# Patient Record
Sex: Female | Born: 1970 | Race: White | Hispanic: No | Marital: Married | Smoking: Never smoker
Health system: Southern US, Community
[De-identification: ages and names within clinical notes are randomized; demographics above are authoritative.]

---

## 2006-01-30 ENCOUNTER — Other Ambulatory Visit: Admission: RE | Admit: 2006-01-30 | Discharge: 2006-01-30 | Payer: Self-pay | Admitting: Internal Medicine

## 2006-10-15 ENCOUNTER — Encounter: Admission: RE | Admit: 2006-10-15 | Discharge: 2006-10-15 | Payer: Self-pay | Admitting: Family Medicine

## 2007-03-18 ENCOUNTER — Ambulatory Visit (HOSPITAL_COMMUNITY): Admission: RE | Admit: 2007-03-18 | Discharge: 2007-03-18 | Payer: Self-pay | Admitting: Obstetrics and Gynecology

## 2007-03-18 ENCOUNTER — Other Ambulatory Visit: Admission: RE | Admit: 2007-03-18 | Discharge: 2007-03-18 | Payer: Self-pay | Admitting: Obstetrics and Gynecology

## 2007-03-31 ENCOUNTER — Ambulatory Visit (HOSPITAL_COMMUNITY): Admission: RE | Admit: 2007-03-31 | Discharge: 2007-03-31 | Payer: Self-pay | Admitting: Obstetrics and Gynecology

## 2007-04-21 ENCOUNTER — Ambulatory Visit (HOSPITAL_COMMUNITY): Admission: RE | Admit: 2007-04-21 | Discharge: 2007-04-21 | Payer: Self-pay | Admitting: Obstetrics and Gynecology

## 2007-05-05 ENCOUNTER — Ambulatory Visit (HOSPITAL_COMMUNITY): Admission: RE | Admit: 2007-05-05 | Discharge: 2007-05-05 | Payer: Self-pay | Admitting: Obstetrics and Gynecology

## 2007-05-29 ENCOUNTER — Ambulatory Visit (HOSPITAL_COMMUNITY): Admission: RE | Admit: 2007-05-29 | Discharge: 2007-05-29 | Payer: Self-pay | Admitting: Obstetrics and Gynecology

## 2007-06-26 ENCOUNTER — Ambulatory Visit (HOSPITAL_COMMUNITY): Admission: RE | Admit: 2007-06-26 | Discharge: 2007-06-26 | Payer: Self-pay | Admitting: Obstetrics and Gynecology

## 2007-09-30 ENCOUNTER — Encounter (INDEPENDENT_AMBULATORY_CARE_PROVIDER_SITE_OTHER): Payer: Self-pay | Admitting: Obstetrics and Gynecology

## 2007-09-30 ENCOUNTER — Inpatient Hospital Stay (HOSPITAL_COMMUNITY): Admission: RE | Admit: 2007-09-30 | Discharge: 2007-10-02 | Payer: Self-pay | Admitting: Obstetrics and Gynecology

## 2008-03-21 ENCOUNTER — Other Ambulatory Visit: Admission: RE | Admit: 2008-03-21 | Discharge: 2008-03-21 | Payer: Self-pay | Admitting: Obstetrics and Gynecology

## 2009-06-28 ENCOUNTER — Other Ambulatory Visit: Admission: RE | Admit: 2009-06-28 | Discharge: 2009-06-28 | Payer: Self-pay | Admitting: Obstetrics and Gynecology

## 2009-07-27 ENCOUNTER — Ambulatory Visit (HOSPITAL_COMMUNITY): Admission: RE | Admit: 2009-07-27 | Discharge: 2009-07-27 | Payer: Self-pay | Admitting: Obstetrics and Gynecology

## 2009-08-16 ENCOUNTER — Encounter: Admission: RE | Admit: 2009-08-16 | Discharge: 2009-08-16 | Payer: Self-pay | Admitting: Obstetrics and Gynecology

## 2010-06-17 ENCOUNTER — Encounter: Payer: Self-pay | Admitting: Obstetrics and Gynecology

## 2010-07-19 ENCOUNTER — Other Ambulatory Visit: Payer: Self-pay | Admitting: Obstetrics and Gynecology

## 2010-07-19 ENCOUNTER — Other Ambulatory Visit (HOSPITAL_COMMUNITY)
Admission: RE | Admit: 2010-07-19 | Discharge: 2010-07-19 | Disposition: A | Discharge status: Home / Self Care | Payer: BC Managed Care – PPO | Source: Ambulatory Visit | Attending: Obstetrics and Gynecology | Admitting: Obstetrics and Gynecology

## 2010-07-19 DIAGNOSIS — Z01419 Encounter for gynecological examination (general) (routine) without abnormal findings: Secondary | ICD-10-CM | POA: Insufficient documentation

## 2010-10-09 NOTE — H&P (Signed)
Courtney Vasquez, Courtney Vasquez NO.:  [id number]   MEDICAL RECORD NO.:  [id number]          PATIENT TYPE:  OUT   LOCATION:                                [facility]:  [facility]   PHYSICIAN:  Gerald Leitz, MD          DATE OF BIRTH:  09/22/2009   DATE OF ADMISSION:  DATE OF DISCHARGE:                              HISTORY & PHYSICAL   HISTORY OF PRESENT ILLNESS:  The patient is scheduled for surgery on Sep 30, 2007.   This is a 39 year old G3, P1-1-0-1 at 35 weeks estimated gestational  age, based on 8 weeks ultrasound with estimated date of delivery, Oct 07, 2007.  Pregnancy complicated by history of __________  infection,  marginal cord insertion, advanced maternal age, chronic back pain with a  bulged disc of L5 and S1.  The patient also desires permanent  sterilization.  She denies any regular contractions, positive fetal  movement.  No vaginal bleeding.  No regular contractions.   PAST MEDICAL HISTORY:  Negative.   PAST SURGICAL HISTORY:  Cesarean section x1 and BNA x1.   MEDICATIONS:  Prenatal vitamins.   ALLERGIES:  No known drug allergies.   PAST OB HISTORY:  Cesarean section x1 with breech presentation, missed  AB at 18 weeks, ___D&E_______  performed.   GYN HISTORY:  No STDs.  No history of abnormal Pap smears.   SOCIAL HISTORY:  The patient is single.  She works for [employer].  She denies tobacco, alcohol, or illicit drug use.   FAMILY HISTORY:  Negative for diabetes.   REVIEW OF SYSTEMS:  Negative, except as stated in history of current  illness.   PHYSICAL EXAMINATION:  VITAL SIGNS:  Blood pressure is 110/60 and weight  is 204 pounds.  GENERAL:  Alert, oriented, and no acute distress.  CARDIOVASCULAR:  Regular rate and rhythm.  LUNGS:  Clear to auscultation bilaterally.  ABDOMEN:  Gravid, nontender.  EXTREMITIES:  Trace edema.  Reflexes +2.  CERVIX:  At last check was closed, thick, and high.   ASSESSMENT AND PLAN:  History of 39 weeks'  pregnancy with a history of  cesarean section, desires repeat permanent sterilization.  Risks,  benefits, and alternatives of surgery were discussed with the patient  including but not limited to infection, bleeding, damage to bowel,  bladder, and  surrounding organs with need for further surgery.  Risks of tubal ligation, 1% risk of failure with a 50% of risk of  ectopic was discussed.  The patient voiced understanding.  We also  discussed risks of blood products, HIV, hepatitis B and C.  The patient  voiced understanding of all risks and desires to proceed with cesarean  section and bilateral tubal ligation.      Gerald Leitz, MD  Electronically Signed     TC/MEDQ  D:  09/22/2007  T:  09/23/2007  Job:  [id number]

## 2010-10-09 NOTE — Op Note (Signed)
NAMESERRA, YOUNAN                 ACCOUNT NO.:  [id number]   MEDICAL RECORD NO.:  [id number]          PATIENT TYPE:  INP   LOCATION:  9123                          [facility]ILITY:  [facility]   PHYSICIAN:  Gerald Leitz, MD          DATE OF BIRTH:  Jan 28, 1971   DATE OF PROCEDURE:  DATE OF DISCHARGE:                               OPERATIVE REPORT   PREOPERATIVE DIAGNOSES:  1. A 39-week intrauterine pregnancy.  2. History of cesarean section.  3. Desires permanent sterilization.   POSTOPERATIVE DIAGNOSES:  1. A 39-week intrauterine pregnancy.  2. History of cesarean section.  3. Desires permanent sterilization.   PROCEDURE:  Repeat cesarean section and bilateral tubal ligation.   SURGEON:  Gerald Leitz, MD.   ASSISTANT:  Charles A. Delcambre, MD.   ANESTHESIA:  Spinal.   FINDINGS:  Female infant, cephalic presentation with Apgar of 9 and 9 at 1  and 5 minutes respectively, and weight 7 pounds 9 ounces.   SPECIMEN:  Placenta.   DISPOSITION:  Specimen labor and delivery.   ESTIMATED BLOOD LOSS:  500 mL.   COMPLICATIONS:  None.   FLUIDS:  Per anesthesia.   INDICATIONS:  This is a 40 year old G3, P 1-1-0-1, at 39 weeks  intrauterine pregnancy with a history of cesarean section and desires  repeat and bilateral tubal ligation.  Informed consent was obtained.   PROCEDURE:  The patient was taken to the operating room where spinal  anesthesia was placed.  She was then prepped and draped in the usual  sterile fashion.  Pfannenstiel skin incision was made with a scalpel and  carried down to the underlying layer of fascia.  The fascia was incised  in the midline.  The incision was extended laterally with Mayo scissors.  The superior aspect of the fascial incision was elevated with Kocher  clamps and the underlying rectus muscles were dissected off with Mayo  scissors.  This was repeated on the inferior aspect of the fascial  incision.  The rectus muscles were separated in the midline.   Peritoneum  was identified and tented up and entered sharply.  An Alexis retractor  was inserted into the peritoneal cavity.  Vesicouterine peritoneum was  identified and entered sharply with Metzenbaum scissors.  Incision was  extended laterally.  The bladder flap was created via sharp dissection  with Metzenbaum scissors.  It was found to be densely adherent to the  lower uterine segment and was therefore taken down with sharp  dissection.  A low transverse incision was then made with the scalpel,  and the infant's head was delivered atraumatically.  Mouth and nose were  bulb suctioned.  Cord was clamped x2 and cut, and the infant was handed  off to the awaiting neonatologist.  The placenta was expressed.  The  uterus was then exteriorized, cleared of all clot and debris.  The  uterine incision was repaired with 0 Vicryl in a running locked fashion.  Second layer of the same suture was used for excellent hemostasis.  Attention was then turned to the left fallopian tube.  A window was made  in the mesosalpinx with the Bovie, and a segment of the tube was suture  ligated with 0 plain gut.  Two sutures of 0 plain gut were used and the  portion of the left fallopian tube was excised with Metzenbaum scissors.  This was repeated on the right fallopian tube.  Excellent hemostasis was  noted.  The uterus was then returned to the abdomen.  Again, excellent  hemostasis was assured.  The Alexis retractor was removed.  The fascia  was closed with 0 PDS.  The Scarpa fascia was closed with 2-0 Vicryl.  Skin was closed with staples.  Sponge, lap, and needle counts were  correct x2.  Ancef 2 grams were given at cord clamp.  The patient was  taken to the recovery room, awake and in stable condition.      Gerald Leitz, MD  Electronically Signed     TC/MEDQ  D:  09/30/2007  T:  09/30/2007  Job:  [id number]

## 2010-10-09 NOTE — H&P (Signed)
Courtney Vasquez, Courtney Vasquez                 ACCOUNT NO.:  [id number]   MEDICAL RECORD NO.:  [id number]          PATIENT TYPE:  INP   LOCATION:  NA                            [facility]   PHYSICIAN:  Gerald Leitz, MD          DATE OF BIRTH:  02-Mar-1971   DATE OF ADMISSION:  09/30/2007  DATE OF DISCHARGE:                              HISTORY & PHYSICAL   HISTORY OF PRESENT ILLNESS:  This is a 40 year old, G3, P 1-1-0-1, at 39  weeks intrauterine pregnancy based on an 8-week ultrasound with  estimated date of delivery Oct 07, 2007.  Pregnancy complicated by a  history of cesarean section.  The patient desires repeat as well as  bilateral tubal ligation. Pregnancy has also been complicated by  marginal cord insertion. Ultrasounds for growth has been normal. The  patient is advanced maternal age and suffers from chronic back pain with  bulging disk at L5 and S1. She reports positive fetal movement.  No  contractions.  No vaginal bleeding.  No leakage of fluid.   PAST MEDICAL HISTORY:  Negative.   PAST SURGICAL HISTORY:  1. Cesarean section x1.  2. D and E  x1.   PAST OB HISTORY:  Cesarean section secondary to breech presentation.  Spontaneous abortion at 18 weeks.   PAST GYN HISTORY:  No history of sexually transmitted diseases.  No  history of abnormal Pap smears   MEDICATIONS:  Prenatal vitamins.   ALLERGIES:  No known drug allergies.   SOCIAL HISTORY:  The patient is single.  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 She denies tobacco, alcohol or  illicit drug use.   FAMILY HISTORY:  Is negative for diabetes.   REVIEW OF SYSTEMS:  Is negative.   PHYSICAL EXAMINATION:  VITAL SIGNS:  Blood pressure 110/60, weight 204  pounds, height 5 feet 2 inches.  CARDIOVASCULAR:  Regular rate and rhythm.  LUNGS:  Clear to auscultation bilaterally.  ABDOMEN:  Gravid, nontender.  EXTREMITIES:  Trace edema.  PELVIC:  Cervix closed, thick and high. Fetal heart rate  150s.   ASSESSMENT AND PLAN:  A 39-week intrauterine pregnancy with history of  cesarean section and desires repeat as well as permanent sterilization.  Risks, benefits, alternatives of cesarean section were discussed with  the patient including but not limited to infection, bleeding, damage to  bowel, bladder or surrounding organs with the need for  further surgery, risk of transfusion, HIV, hepatitis B and C were  discussed.  Risk of tubal ligation with a 1% failure , 50% risk of  ectopic pregnancy if failure occurs which can be life threatening were  discussed with the patient.  She is aware of these risks.  She desires  to proceed with repeat cesarean section and bilateral tubal ligation.      Gerald Leitz, MD  Electronically Signed     TC/MEDQ  D:  09/29/2007  T:  09/29/2007  Job:  [id number]

## 2010-10-12 NOTE — Discharge Summary (Signed)
NAMEAHLIA, Courtney Vasquez                 ACCOUNT NO.:  [id number]   MEDICAL RECORD NO.:  [id number]          PATIENT TYPE:  INP   LOCATION:  9123                          [facility]   PHYSICIAN:  Gerald Leitz, MD          DATE OF BIRTH:  1970-09-18   DATE OF ADMISSION:  09/30/2007  DATE OF DISCHARGE:  10/02/2007                               DISCHARGE SUMMARY   ADMISSION DIAGNOSES:  1. A 39-week intrauterine pregnancy.  2. History of cesarean section.  3. Desires permanent tubal sterilization.   POSTOPERATIVE DIAGNOSES:  1. A 39-week intrauterine pregnancy.  2. History of cesarean section.  3. Desires permanent tubal sterilization.  4. Status post repeat cesarean section with bilateral tubal ligation.   BRIEF HOSPITAL COURSE:  The patient was admitted on Sep 30, 2007, and  underwent a scheduled repeat cesarean section with bilateral tubal  ligation, delivered a female infant with Apgars of 9 and 9 at one and five  minutes respectively, weighing 7 pounds 9 ounces.  The patient did well  postoperatively.  Hemoglobin on postop day #1 was 11.7.  She was  discharged home on postop day #2 in stable condition on the following  medications, Motrin and Percocet.  She will return to clinic on Oct 05, 2007, for staple removal.   DISCHARGE CONDITION:  Improved and stable.      Gerald Leitz, MD  Electronically Signed     TC/MEDQ  D:  11/17/2007  T:  11/18/2007  Job:  [id number]

## 2010-10-25 ENCOUNTER — Other Ambulatory Visit (HOSPITAL_COMMUNITY): Payer: Self-pay | Admitting: Obstetrics and Gynecology

## 2010-10-25 DIAGNOSIS — Z1231 Encounter for screening mammogram for malignant neoplasm of breast: Secondary | ICD-10-CM

## 2011-01-04 ENCOUNTER — Ambulatory Visit (HOSPITAL_COMMUNITY): Payer: BC Managed Care – PPO

## 2011-01-17 ENCOUNTER — Ambulatory Visit (HOSPITAL_COMMUNITY): Payer: BC Managed Care – PPO

## 2011-01-31 ENCOUNTER — Ambulatory Visit (HOSPITAL_COMMUNITY)
Admission: RE | Admit: 2011-01-31 | Discharge: 2011-01-31 | Disposition: A | Discharge status: Home / Self Care | Payer: BC Managed Care – PPO | Source: Ambulatory Visit | Attending: Obstetrics and Gynecology | Admitting: Obstetrics and Gynecology

## 2011-01-31 DIAGNOSIS — Z1231 Encounter for screening mammogram for malignant neoplasm of breast: Secondary | ICD-10-CM | POA: Insufficient documentation

## 2011-08-27 ENCOUNTER — Other Ambulatory Visit: Payer: Self-pay | Admitting: Obstetrics and Gynecology

## 2011-08-27 ENCOUNTER — Other Ambulatory Visit (HOSPITAL_COMMUNITY)
Admission: RE | Admit: 2011-08-27 | Discharge: 2011-08-27 | Disposition: A | Discharge status: Home / Self Care | Payer: BC Managed Care – PPO | Source: Ambulatory Visit | Attending: Obstetrics and Gynecology | Admitting: Obstetrics and Gynecology

## 2011-08-27 DIAGNOSIS — Z01419 Encounter for gynecological examination (general) (routine) without abnormal findings: Secondary | ICD-10-CM | POA: Insufficient documentation

## 2012-01-15 ENCOUNTER — Other Ambulatory Visit (HOSPITAL_COMMUNITY): Payer: Self-pay | Admitting: Obstetrics and Gynecology

## 2012-01-15 DIAGNOSIS — Z1231 Encounter for screening mammogram for malignant neoplasm of breast: Secondary | ICD-10-CM

## 2012-02-06 ENCOUNTER — Ambulatory Visit (HOSPITAL_COMMUNITY)
Admission: RE | Admit: 2012-02-06 | Discharge: 2012-02-06 | Disposition: A | Discharge status: Home / Self Care | Payer: BC Managed Care – PPO | Source: Ambulatory Visit | Attending: Obstetrics and Gynecology | Admitting: Obstetrics and Gynecology

## 2012-02-06 DIAGNOSIS — Z1231 Encounter for screening mammogram for malignant neoplasm of breast: Secondary | ICD-10-CM | POA: Insufficient documentation

## 2012-02-11 ENCOUNTER — Other Ambulatory Visit: Payer: Self-pay | Admitting: Obstetrics and Gynecology

## 2012-02-11 DIAGNOSIS — R928 Other abnormal and inconclusive findings on diagnostic imaging of breast: Secondary | ICD-10-CM

## 2012-02-12 ENCOUNTER — Ambulatory Visit
Admission: RE | Admit: 2012-02-12 | Discharge: 2012-02-12 | Disposition: A | Discharge status: Home / Self Care | Payer: BC Managed Care – PPO | Source: Ambulatory Visit | Attending: Obstetrics and Gynecology | Admitting: Obstetrics and Gynecology

## 2012-02-12 DIAGNOSIS — R928 Other abnormal and inconclusive findings on diagnostic imaging of breast: Secondary | ICD-10-CM

## 2012-08-26 ENCOUNTER — Other Ambulatory Visit: Payer: Self-pay | Admitting: Obstetrics and Gynecology

## 2012-08-26 ENCOUNTER — Other Ambulatory Visit (HOSPITAL_COMMUNITY)
Admission: RE | Admit: 2012-08-26 | Discharge: 2012-08-26 | Disposition: A | Discharge status: Home / Self Care | Payer: Self-pay | Source: Ambulatory Visit | Attending: Obstetrics and Gynecology | Admitting: Obstetrics and Gynecology

## 2012-08-26 DIAGNOSIS — Z01419 Encounter for gynecological examination (general) (routine) without abnormal findings: Secondary | ICD-10-CM | POA: Insufficient documentation

## 2012-08-26 DIAGNOSIS — Z1151 Encounter for screening for human papillomavirus (HPV): Secondary | ICD-10-CM | POA: Insufficient documentation

## 2012-11-25 ENCOUNTER — Encounter (HOSPITAL_COMMUNITY): Payer: Self-pay | Admitting: *Deleted

## 2012-11-25 ENCOUNTER — Emergency Department (HOSPITAL_COMMUNITY)
Admission: EM | Admit: 2012-11-25 | Discharge: 2012-11-25 | Disposition: A | Discharge status: Home / Self Care | Payer: Managed Care, Other (non HMO) | Source: Home / Self Care | Attending: Emergency Medicine | Admitting: Emergency Medicine

## 2012-11-25 ENCOUNTER — Emergency Department (INDEPENDENT_AMBULATORY_CARE_PROVIDER_SITE_OTHER): Payer: Managed Care, Other (non HMO)

## 2012-11-25 DIAGNOSIS — N201 Calculus of ureter: Secondary | ICD-10-CM

## 2012-11-25 LAB — POCT URINALYSIS DIP (DEVICE)
Bilirubin Urine: NEGATIVE
Leukocytes, UA: NEGATIVE
Nitrite: NEGATIVE
Protein, ur: NEGATIVE mg/dL
Urobilinogen, UA: 0.2 mg/dL (ref 0.0–1.0)

## 2012-11-25 MED ORDER — ONDANSETRON HCL 4 MG PO TABS: 4.0000 mg | ORAL_TABLET | Freq: Three times a day (TID) | ORAL | Status: AC | PRN

## 2012-11-25 MED ORDER — HYDROCODONE-ACETAMINOPHEN 5-325 MG PO TABS: 1.0000 | ORAL_TABLET | ORAL | Status: AC | PRN

## 2012-11-25 MED ORDER — TAMSULOSIN HCL 0.4 MG PO CAPS: 0.4000 mg | ORAL_CAPSULE | Freq: Every day | ORAL | Status: AC

## 2012-11-25 NOTE — ED Provider Notes (Addendum)
History    CSN: [id number] Arrival date & time 11/25/12  4098  First MD Initiated Contact with Patient 11/25/12 [phone]     Chief Complaint  Patient presents with  . Flank Pain   (Consider location/radiation/quality/duration/timing/severity/associated sxs/prior Treatment) HPI Comments: Patient presents to urgent care reporting intermittent left flank pain that started on her left upper back radiating to the lower anterior aspect of her lower abdomen. Has been ongoing for 2 days. This morning improved and her pain scale is 0. She has felt nauseous at times but have not had any vomiting. Have not had any fevers. Denies any recent trauma or injury. Denies any diarrhea as. Patient does not have any chronic medical problems nor have had a kidney stone in the past.  Patient is a 42 y.o. female presenting with flank pain. The history is provided by the patient.  Flank Pain This is a new problem. The current episode started 2 days ago. The problem occurs constantly. The problem has not changed since onset.Associated symptoms include abdominal pain. Pertinent negatives include no headaches and no shortness of breath. Nothing aggravates the symptoms. Nothing relieves the symptoms. She has tried nothing for the symptoms. The treatment provided no relief.   History reviewed. No pertinent past medical history. History reviewed. No pertinent past surgical history. History reviewed. No pertinent family history. History  Substance Use Topics  . Smoking status: Never Smoker   . Smokeless tobacco: Not on file  . Alcohol Use: No   OB History   Grav Para Term Preterm Abortions TAB SAB Ect Mult Living                 Review of Systems  Constitutional: Negative for fever, chills, diaphoresis, activity change, appetite change and fatigue.  Respiratory: Negative for choking, chest tightness, shortness of breath and wheezing.   Gastrointestinal: Positive for abdominal pain.  Genitourinary: Positive for  flank pain and pelvic pain. Negative for dysuria, urgency, decreased urine volume, vaginal bleeding, vaginal discharge, enuresis, difficulty urinating, genital sores, vaginal pain and dyspareunia.  Skin: Negative for pallor and rash.  Neurological: Negative for headaches.    Allergies  Review of patient's allergies indicates not on file.  Home Medications   Current Outpatient Rx  Name  Route  Sig  Dispense  Refill  . HYDROcodone-acetaminophen (NORCO/VICODIN) 5-325 MG per tablet   Oral   Take 1 tablet by mouth every 4 (four) hours as needed for pain.   15 tablet   0   . ondansetron (ZOFRAN) 4 MG tablet   Oral   Take 1 tablet (4 mg total) by mouth every 8 (eight) hours as needed for nausea.   15 tablet   0   . tamsulosin (FLOMAX) 0.4 MG CAPS   Oral   Take 1 capsule (0.4 mg total) by mouth daily.   4 capsule   0    BP 126/68  Pulse 76  Temp(Src) 98.3 F (36.8 C) (Oral)  Resp 16  SpO2 100%  LMP 11/08/2012 Physical Exam  Nursing note and vitals reviewed. Constitutional: Vital signs are normal. She appears well-developed and well-nourished.  Non-toxic appearance. She does not have a sickly appearance. She does not appear ill. No distress.  Eyes: Conjunctivae are normal.  Neck: Neck supple.  Pulmonary/Chest: Effort normal and breath sounds normal.  Abdominal: Soft. Normal appearance. She exhibits no distension and no mass. There is no hepatosplenomegaly or hepatomegaly. There is tenderness in the suprapubic area. There is no rigidity, no rebound,  no guarding, no CVA tenderness, no tenderness at McBurney's point and negative Murphy's sign.    Neurological: She is alert.  Skin: No rash noted. No erythema. No pallor.  Psychiatric: She has a normal mood and affect. Her speech is normal and behavior is normal.    ED Course  Procedures (including critical care time) Labs Reviewed  POCT URINALYSIS DIP (DEVICE) - Abnormal; Notable for the following:    Hgb urine dipstick  LARGE (*)    All other components within normal limits   Dg Abd 1 View  11/25/2012   *RADIOLOGY REPORT*  Clinical Data: Left lower quadrant abdominal pain.  Clinical concern for a kidney stone.  ABDOMEN - 1 VIEW  Comparison: None.  Findings: 4 mm oval calcification overlying the left L2 transverse process.  Bilateral pelvic phleboliths.  Normal bowel gas pattern. Unremarkable bones.  IMPRESSION: 4 mm oval calcification overlying the left L2 transverse process. This could represent a phlebolith or ureteral pelvic junction calculus.   Original Report Authenticated By: Beckie Salts, M.D.   1. Ureteral calculus, left     MDM  Problem #1 Ureterolithiasis Symptomatology and x-rays consistent with a nonobstructive- 4 mm calcification on the ureteral pelvic junction. Patient is afebrile looks comfortable in no pain at this point.  Plan of care: Pain management provider with 15 tablets of Lortab to be used as needed Zofran 4 mg tablets to be used as needed total of 15 tablets Flomax to be used for the next 72 hours 0.4 mg daily for 4 days Have discussed with patient symptoms that should warrant further evaluation at the [facility] long emergency department.Patient was providers trainer to monitor and observe her urine. Instructed to followup with [facility] urology for further evaluation- subacutely    Jimmie Molly, MD 11/25/12 1040  Jimmie Molly, MD 11/25/12 1041  Jimmie Molly, MD 11/25/12 1042

## 2012-11-25 NOTE — ED Notes (Signed)
Pt  Reports  intermittant  l   Flank pain radiating to llq    X  2  Days  At this  Time  She  Is pain free       Verbalizes     Nausea   But  No vomiting        Ambulates  Upright  With a  Steady  Fluid  Gait

## 2013-03-01 ENCOUNTER — Other Ambulatory Visit: Payer: Self-pay

## 2013-03-01 DIAGNOSIS — Z1231 Encounter for screening mammogram for malignant neoplasm of breast: Secondary | ICD-10-CM

## 2013-03-09 ENCOUNTER — Ambulatory Visit: Payer: Managed Care, Other (non HMO)

## 2013-03-31 ENCOUNTER — Ambulatory Visit
Admission: RE | Admit: 2013-03-31 | Discharge: 2013-03-31 | Disposition: A | Discharge status: Home / Self Care | Payer: Managed Care, Other (non HMO) | Source: Ambulatory Visit

## 2013-03-31 DIAGNOSIS — Z1231 Encounter for screening mammogram for malignant neoplasm of breast: Secondary | ICD-10-CM

## 2013-09-22 ENCOUNTER — Other Ambulatory Visit: Payer: Self-pay | Admitting: Obstetrics and Gynecology

## 2013-09-22 ENCOUNTER — Other Ambulatory Visit (HOSPITAL_COMMUNITY)
Admission: RE | Admit: 2013-09-22 | Discharge: 2013-09-22 | Disposition: A | Discharge status: Home / Self Care | Payer: Managed Care, Other (non HMO) | Source: Ambulatory Visit | Attending: Obstetrics and Gynecology | Admitting: Obstetrics and Gynecology

## 2013-09-22 DIAGNOSIS — Z01419 Encounter for gynecological examination (general) (routine) without abnormal findings: Secondary | ICD-10-CM | POA: Insufficient documentation

## 2013-09-22 DIAGNOSIS — Z113 Encounter for screening for infections with a predominantly sexual mode of transmission: Secondary | ICD-10-CM | POA: Insufficient documentation

## 2014-03-04 ENCOUNTER — Other Ambulatory Visit: Payer: Self-pay

## 2014-03-04 DIAGNOSIS — Z1239 Encounter for other screening for malignant neoplasm of breast: Secondary | ICD-10-CM

## 2014-04-01 ENCOUNTER — Other Ambulatory Visit: Payer: Self-pay

## 2014-04-01 ENCOUNTER — Ambulatory Visit
Admission: RE | Admit: 2014-04-01 | Discharge: 2014-04-01 | Disposition: A | Discharge status: Home / Self Care | Payer: Managed Care, Other (non HMO) | Source: Ambulatory Visit

## 2014-04-01 DIAGNOSIS — Z1231 Encounter for screening mammogram for malignant neoplasm of breast: Secondary | ICD-10-CM

## 2014-09-23 ENCOUNTER — Other Ambulatory Visit: Payer: Self-pay | Admitting: Obstetrics and Gynecology

## 2014-09-23 ENCOUNTER — Other Ambulatory Visit (HOSPITAL_COMMUNITY)
Admission: RE | Admit: 2014-09-23 | Discharge: 2014-09-23 | Disposition: A | Discharge status: Home / Self Care | Payer: Managed Care, Other (non HMO) | Source: Ambulatory Visit | Attending: Obstetrics and Gynecology | Admitting: Obstetrics and Gynecology

## 2014-09-23 DIAGNOSIS — Z01419 Encounter for gynecological examination (general) (routine) without abnormal findings: Secondary | ICD-10-CM | POA: Insufficient documentation

## 2014-09-27 LAB — CYTOLOGY - PAP

## 2015-03-06 ENCOUNTER — Other Ambulatory Visit: Payer: Self-pay

## 2015-03-06 DIAGNOSIS — Z1231 Encounter for screening mammogram for malignant neoplasm of breast: Secondary | ICD-10-CM

## 2015-04-19 ENCOUNTER — Ambulatory Visit
Admission: RE | Admit: 2015-04-19 | Discharge: 2015-04-19 | Disposition: A | Discharge status: Home / Self Care | Payer: Managed Care, Other (non HMO) | Source: Ambulatory Visit

## 2015-04-19 DIAGNOSIS — Z1231 Encounter for screening mammogram for malignant neoplasm of breast: Secondary | ICD-10-CM

## 2016-02-27 ENCOUNTER — Other Ambulatory Visit: Payer: Self-pay | Admitting: Obstetrics and Gynecology

## 2016-02-27 DIAGNOSIS — Z1231 Encounter for screening mammogram for malignant neoplasm of breast: Secondary | ICD-10-CM

## 2016-04-22 ENCOUNTER — Ambulatory Visit
Admission: RE | Admit: 2016-04-22 | Discharge: 2016-04-22 | Disposition: A | Discharge status: Home / Self Care | Payer: Managed Care, Other (non HMO) | Source: Ambulatory Visit | Attending: Obstetrics and Gynecology | Admitting: Obstetrics and Gynecology

## 2016-04-22 DIAGNOSIS — Z1231 Encounter for screening mammogram for malignant neoplasm of breast: Secondary | ICD-10-CM

## 2016-11-19 ENCOUNTER — Other Ambulatory Visit: Payer: Self-pay | Admitting: Obstetrics and Gynecology

## 2016-11-19 ENCOUNTER — Other Ambulatory Visit (HOSPITAL_COMMUNITY)
Admission: RE | Admit: 2016-11-19 | Discharge: 2016-11-19 | Disposition: A | Discharge status: Home / Self Care | Payer: Managed Care, Other (non HMO) | Source: Ambulatory Visit | Attending: Obstetrics and Gynecology | Admitting: Obstetrics and Gynecology

## 2016-11-19 DIAGNOSIS — Z124 Encounter for screening for malignant neoplasm of cervix: Secondary | ICD-10-CM | POA: Diagnosis present

## 2016-11-22 LAB — CYTOLOGY - PAP
Chlamydia: NEGATIVE
DIAGNOSIS: NEGATIVE
HPV: NOT DETECTED
Neisseria Gonorrhea: NEGATIVE

## 2016-12-20 ENCOUNTER — Other Ambulatory Visit: Payer: Self-pay | Admitting: Obstetrics and Gynecology

## 2016-12-20 DIAGNOSIS — Z1231 Encounter for screening mammogram for malignant neoplasm of breast: Secondary | ICD-10-CM

## 2017-04-23 ENCOUNTER — Ambulatory Visit
Admission: RE | Admit: 2017-04-23 | Discharge: 2017-04-23 | Disposition: A | Discharge status: Home / Self Care | Payer: Managed Care, Other (non HMO) | Source: Ambulatory Visit | Attending: Obstetrics and Gynecology | Admitting: Obstetrics and Gynecology

## 2017-04-23 DIAGNOSIS — Z1231 Encounter for screening mammogram for malignant neoplasm of breast: Secondary | ICD-10-CM

## 2017-04-24 ENCOUNTER — Other Ambulatory Visit: Payer: Self-pay | Admitting: Obstetrics and Gynecology

## 2017-04-24 DIAGNOSIS — R928 Other abnormal and inconclusive findings on diagnostic imaging of breast: Secondary | ICD-10-CM

## 2017-04-30 ENCOUNTER — Ambulatory Visit: Payer: Managed Care, Other (non HMO)

## 2017-04-30 ENCOUNTER — Ambulatory Visit
Admission: RE | Admit: 2017-04-30 | Discharge: 2017-04-30 | Disposition: A | Discharge status: Home / Self Care | Payer: Managed Care, Other (non HMO) | Source: Ambulatory Visit | Attending: Obstetrics and Gynecology | Admitting: Obstetrics and Gynecology

## 2017-04-30 DIAGNOSIS — R928 Other abnormal and inconclusive findings on diagnostic imaging of breast: Secondary | ICD-10-CM

## 2017-08-06 ENCOUNTER — Other Ambulatory Visit: Payer: Self-pay | Admitting: Occupational Medicine

## 2017-08-06 ENCOUNTER — Ambulatory Visit: Payer: Self-pay

## 2017-08-06 DIAGNOSIS — M25521 Pain in right elbow: Secondary | ICD-10-CM

## 2017-08-06 DIAGNOSIS — M25562 Pain in left knee: Secondary | ICD-10-CM

## 2017-12-02 ENCOUNTER — Other Ambulatory Visit: Payer: Self-pay | Admitting: Cardiovascular Disease

## 2017-12-02 NOTE — Telephone Encounter (Signed)
This is not my patient and should be a PCP request.

## 2018-03-05 ENCOUNTER — Other Ambulatory Visit: Payer: Self-pay | Admitting: Obstetrics and Gynecology

## 2018-03-05 DIAGNOSIS — Z1231 Encounter for screening mammogram for malignant neoplasm of breast: Secondary | ICD-10-CM

## 2018-04-27 ENCOUNTER — Ambulatory Visit
Admission: RE | Admit: 2018-04-27 | Discharge: 2018-04-27 | Disposition: A | Discharge status: Home / Self Care | Payer: Managed Care, Other (non HMO) | Source: Ambulatory Visit | Attending: Obstetrics and Gynecology | Admitting: Obstetrics and Gynecology

## 2018-04-27 DIAGNOSIS — Z1231 Encounter for screening mammogram for malignant neoplasm of breast: Secondary | ICD-10-CM

## 2018-12-15 ENCOUNTER — Other Ambulatory Visit: Payer: Self-pay | Admitting: Obstetrics and Gynecology

## 2018-12-15 DIAGNOSIS — Z1231 Encounter for screening mammogram for malignant neoplasm of breast: Secondary | ICD-10-CM

## 2019-04-27 ENCOUNTER — Ambulatory Visit
Admission: RE | Admit: 2019-04-27 | Discharge: 2019-04-27 | Disposition: A | Discharge status: Home / Self Care | Payer: Managed Care, Other (non HMO) | Source: Ambulatory Visit | Attending: Obstetrics and Gynecology | Admitting: Obstetrics and Gynecology

## 2019-04-27 ENCOUNTER — Other Ambulatory Visit: Payer: Self-pay

## 2019-04-27 DIAGNOSIS — Z1231 Encounter for screening mammogram for malignant neoplasm of breast: Secondary | ICD-10-CM

## 2019-04-28 ENCOUNTER — Other Ambulatory Visit: Payer: Self-pay | Admitting: Obstetrics and Gynecology

## 2019-04-28 DIAGNOSIS — R928 Other abnormal and inconclusive findings on diagnostic imaging of breast: Secondary | ICD-10-CM

## 2019-04-29 ENCOUNTER — Ambulatory Visit: Payer: Managed Care, Other (non HMO)

## 2019-05-03 ENCOUNTER — Ambulatory Visit
Admission: RE | Admit: 2019-05-03 | Discharge: 2019-05-03 | Disposition: A | Discharge status: Home / Self Care | Payer: Managed Care, Other (non HMO) | Source: Ambulatory Visit | Attending: Obstetrics and Gynecology | Admitting: Obstetrics and Gynecology

## 2019-05-03 ENCOUNTER — Other Ambulatory Visit: Payer: Self-pay

## 2019-05-03 DIAGNOSIS — R928 Other abnormal and inconclusive findings on diagnostic imaging of breast: Secondary | ICD-10-CM

## 2019-05-04 ENCOUNTER — Other Ambulatory Visit: Payer: Managed Care, Other (non HMO)

## 2020-02-23 ENCOUNTER — Other Ambulatory Visit: Payer: Self-pay | Admitting: Obstetrics and Gynecology

## 2020-02-23 DIAGNOSIS — Z1231 Encounter for screening mammogram for malignant neoplasm of breast: Secondary | ICD-10-CM

## 2020-04-27 ENCOUNTER — Ambulatory Visit: Payer: Managed Care, Other (non HMO)

## 2020-04-27 ENCOUNTER — Ambulatory Visit
Admission: RE | Admit: 2020-04-27 | Discharge: 2020-04-27 | Disposition: A | Discharge status: Home / Self Care | Payer: 59 | Source: Ambulatory Visit | Attending: Obstetrics and Gynecology | Admitting: Obstetrics and Gynecology

## 2020-04-27 ENCOUNTER — Other Ambulatory Visit: Payer: Self-pay

## 2020-04-27 DIAGNOSIS — Z1231 Encounter for screening mammogram for malignant neoplasm of breast: Secondary | ICD-10-CM

## 2021-01-15 ENCOUNTER — Ambulatory Visit (HOSPITAL_COMMUNITY)
Admission: EM | Admit: 2021-01-15 | Discharge: 2021-01-15 | Disposition: A | Discharge status: Home / Self Care | Payer: 59 | Attending: Student | Admitting: Student

## 2021-01-15 ENCOUNTER — Other Ambulatory Visit: Payer: Self-pay

## 2021-01-15 ENCOUNTER — Encounter (HOSPITAL_COMMUNITY): Payer: Self-pay

## 2021-01-15 ENCOUNTER — Ambulatory Visit (INDEPENDENT_AMBULATORY_CARE_PROVIDER_SITE_OTHER): Payer: 59

## 2021-01-15 DIAGNOSIS — M79645 Pain in left finger(s): Secondary | ICD-10-CM

## 2021-01-15 DIAGNOSIS — M7989 Other specified soft tissue disorders: Secondary | ICD-10-CM

## 2021-01-15 MED ORDER — DOXYCYCLINE HYCLATE 100 MG PO CAPS: 100.0000 mg | ORAL_CAPSULE | Freq: Two times a day (BID) | ORAL | 0 refills | Status: AC

## 2021-01-15 NOTE — ED Triage Notes (Signed)
Pt in with c/o left index finger swelling x 2 weeks  States she was doing yard work and was poked by a thorn  Swelling noted, pt would like an x-ray

## 2021-01-15 NOTE — ED Provider Notes (Signed)
[facility]    CSN: [id number] Arrival date & time: 01/15/21  1053      History   Chief Complaint Chief Complaint  Patient presents with   finger swelling    HPI Courtney Vasquez is a 50 y.o. female presenting with L index finger swelling x2 weeks following being poked by a thorn.  Medical history noncontributory.  States that she did have to remove a thorn from the index finger, and she is concerned that some of the thorn is still inside. Swelling and pain getting worse. She is requesting an x-ray.  Endorses some pain with bending the finger, but denies sensation changes.  Feeling well otherwise.  HPI  History reviewed. No pertinent past medical history.  There are no problems to display for this patient.   History reviewed. No pertinent surgical history.  OB History   No obstetric history on file.      Home Medications    Prior to Admission medications   Medication Sig Start Date End Date Taking? Authorizing Provider  doxycycline (VIBRAMYCIN) 100 MG capsule Take 1 capsule (100 mg total) by mouth 2 (two) times daily for 7 days. 01/15/21 01/22/21 Yes Rhys Martini, PA-C  HYDROcodone-acetaminophen (NORCO/VICODIN) 5-325 MG per tablet Take 1 tablet by mouth every 4 (four) hours as needed for pain. 11/25/12   Jimmie Molly, MD  ondansetron (ZOFRAN) 4 MG tablet Take 1 tablet (4 mg total) by mouth every 8 (eight) hours as needed for nausea. 11/25/12   Jimmie Molly, MD    Family History History reviewed. No pertinent family history.  Social History Social History   Tobacco Use   Smoking status: Never   Smokeless tobacco: Never  Vaping Use   Vaping Use: Never used  Substance Use Topics   Alcohol use: No     Allergies   Patient has no known allergies.   Review of Systems Review of Systems  Skin:  Positive for wound.  All other systems reviewed and are negative.   Physical Exam Triage Vital Signs ED Triage Vitals  Enc Vitals Group     BP 01/15/21 1210  132/79     Pulse Rate 01/15/21 1210 65     Resp 01/15/21 1210 18     Temp 01/15/21 1210 97.9 F (36.6 C)     Temp Source 01/15/21 1210 Oral     SpO2 01/15/21 1210 100 %     Weight --      Height --      Head Circumference --      Peak Flow --      Pain Score 01/15/21 1208 0     Pain Loc --      Pain Edu? --      Excl. in GC? --    No data found.  Updated Vital Signs BP 132/79 (BP Location: Right Arm)   Pulse 65   Temp 97.9 F (36.6 C) (Oral)   Resp 18   SpO2 100%   Visual Acuity Right Eye Distance:   Left Eye Distance:   Bilateral Distance:    Right Eye Near:   Left Eye Near:    Bilateral Near:     Physical Exam Vitals reviewed.  Constitutional:      General: She is not in acute distress.    Appearance: Normal appearance. She is not ill-appearing or diaphoretic.  HENT:     Head: Normocephalic and atraumatic.  Cardiovascular:     Rate and Rhythm: Normal rate and  regular rhythm.     Heart sounds: Normal heart sounds.  Pulmonary:     Effort: Pulmonary effort is normal.     Breath sounds: Normal breath sounds.  Musculoskeletal:     Comments: L index finger- tenderness and effusion limited to PIP joint. No erythema, warmth. ROM intact but with pain. Cap refill <2 seconds, radial pulse 2+.  Skin:    General: Skin is warm.  Neurological:     General: No focal deficit present.     Mental Status: She is alert and oriented to person, place, and time.  Psychiatric:        Mood and Affect: Mood normal.        Behavior: Behavior normal.        Thought Content: Thought content normal.        Judgment: Judgment normal.     UC Treatments / Results  Labs (all labs ordered are listed, but only abnormal results are displayed) Labs Reviewed - No data to display  EKG   Radiology DG Finger Index Left  Result Date: 01/15/2021 CLINICAL DATA:  L index finger- PIP with effusion and tenderness x2 weeks following puncture wound from rose. ROM intact. EXAM: LEFT INDEX  FINGER 2+V COMPARISON:  None. FINDINGS: There is no evidence of fracture or dislocation. No erosion or periostitis. There is no evidence of arthropathy or other focal bone abnormality. Mild soft tissue swelling most pronounced at the level of the PIP joint. No soft tissue gas. No radiopaque foreign body is seen. IMPRESSION: Soft tissue swelling without radiographic evidence of osteomyelitis. No radiopaque foreign body or soft tissue gas. Electronically Signed   By: Duanne Guess D.O.   On: 01/15/2021 13:33    Procedures Procedures (including critical care time)  Medications Ordered in UC Medications - No data to display  Initial Impression / Assessment and Plan / UC Course  I have reviewed the triage vital signs and the nursing notes.  Pertinent labs & imaging results that were available during my care of the patient were reviewed by me and considered in my medical decision making (see chart for details).     This patient is a very pleasant 50 y.o. year old female presenting with soft tissue swelling L index finger PIP following pricking finger on rose x2 weeks. Afebrile, nontachy. States she is not pregnant or breastfeeding. ROM intact; low suspicion for flexor tenosynovitis.   Xray L index finger- Soft tissue swelling without radiographic evidence of osteomyelitis. No radiopaque foreign body or soft tissue gas.  Doxycycline. F/u with hand.  ED return precautions discussed. Patient verbalizes understanding and agreement.    Final Clinical Impressions(s) / UC Diagnoses   Final diagnoses:  Swelling of left index finger     Discharge Instructions      -Doxycycline twice daily for 7 days.  Make sure to wear sunscreen while spending time outside while on this medication as it can increase your chance of sunburn. You can take this medication with food if you have a sensitive stomach. -Wash your wound with gentle soap and water 1-2 times daily.  Let air dry or gently pat. You can  follow with over-the-counter neosporin ointment (or similar) if desired, but you don't have to. Avoid cleansing with hydrogen peroxide or alcohol!! -Tylenol/ibuprofen, rest, ice for discomfort -Seek additional medical attention if the wound is getting worse instead of better- redness increasing in size, pain getting worse, new/worsening discharge, new fevers/chills, etc. -If symptoms persist in 3 days, or if  they're getting worse, follow-up with an orthopedist. I recommend [facility] at [address], [place], [place] [address]. You can schedule an appointment by calling [phone]) or online ([email or web address]), but they also have a walk-in clinic M-F 8a-8p and Sat 10a-3p.       ED Prescriptions     Medication Sig Dispense Auth. Provider   doxycycline (VIBRAMYCIN) 100 MG capsule Take 1 capsule (100 mg total) by mouth 2 (two) times daily for 7 days. 14 capsule Rhys Martini, PA-C      PDMP not reviewed this encounter.   Rhys Martini, PA-C 01/15/21 1410

## 2021-01-15 NOTE — Discharge Instructions (Addendum)
-  Doxycycline twice daily for 7 days.  Make sure to wear sunscreen while spending time outside while on this medication as it can increase your chance of sunburn. You can take this medication with food if you have a sensitive stomach. -Wash your wound with gentle soap and water 1-2 times daily.  Let air dry or gently pat. You can follow with over-the-counter neosporin ointment (or similar) if desired, but you don't have to. Avoid cleansing with hydrogen peroxide or alcohol!! -Tylenol/ibuprofen, rest, ice for discomfort -Seek additional medical attention if the wound is getting worse instead of better- redness increasing in size, pain getting worse, new/worsening discharge, new fevers/chills, etc. -If symptoms persist in 3 days, or if they're getting worse, follow-up with an orthopedist. I recommend [facility] at [address] [place], [place] [address]. You can schedule an appointment by calling [phone]) or online ([email or web address]), but they also have a walk-in clinic M-F 8a-8p and Sat 10a-3p.

## 2021-03-13 ENCOUNTER — Other Ambulatory Visit: Payer: Self-pay | Admitting: Obstetrics and Gynecology

## 2021-03-13 DIAGNOSIS — Z1231 Encounter for screening mammogram for malignant neoplasm of breast: Secondary | ICD-10-CM

## 2021-04-30 ENCOUNTER — Ambulatory Visit: Payer: 59

## 2021-06-04 ENCOUNTER — Ambulatory Visit: Payer: 59

## 2021-06-28 ENCOUNTER — Ambulatory Visit: Payer: 59

## 2021-06-29 ENCOUNTER — Ambulatory Visit
Admission: RE | Admit: 2021-06-29 | Discharge: 2021-06-29 | Disposition: A | Discharge status: Home / Self Care | Payer: 59 | Source: Ambulatory Visit | Attending: Obstetrics and Gynecology | Admitting: Obstetrics and Gynecology

## 2021-06-29 ENCOUNTER — Other Ambulatory Visit: Payer: Self-pay

## 2021-06-29 DIAGNOSIS — Z1231 Encounter for screening mammogram for malignant neoplasm of breast: Secondary | ICD-10-CM

## 2021-07-13 ENCOUNTER — Other Ambulatory Visit (HOSPITAL_COMMUNITY)
Admission: RE | Admit: 2021-07-13 | Discharge: 2021-07-13 | Disposition: A | Discharge status: Home / Self Care | Payer: 59 | Source: Ambulatory Visit | Attending: Obstetrics and Gynecology | Admitting: Obstetrics and Gynecology

## 2021-07-13 DIAGNOSIS — Z01419 Encounter for gynecological examination (general) (routine) without abnormal findings: Secondary | ICD-10-CM | POA: Diagnosis present

## 2021-07-17 LAB — CYTOLOGY - PAP
Comment: NEGATIVE
Diagnosis: NEGATIVE
Diagnosis: REACTIVE
High risk HPV: NEGATIVE

## 2022-06-05 ENCOUNTER — Other Ambulatory Visit: Payer: Self-pay | Admitting: Obstetrics and Gynecology

## 2022-06-05 DIAGNOSIS — Z1231 Encounter for screening mammogram for malignant neoplasm of breast: Secondary | ICD-10-CM

## 2022-07-29 ENCOUNTER — Ambulatory Visit
Admission: RE | Admit: 2022-07-29 | Discharge: 2022-07-29 | Disposition: A | Discharge status: Home / Self Care | Payer: 59 | Source: Ambulatory Visit | Attending: Obstetrics and Gynecology | Admitting: Obstetrics and Gynecology

## 2022-07-29 DIAGNOSIS — Z1231 Encounter for screening mammogram for malignant neoplasm of breast: Secondary | ICD-10-CM

## 2023-02-21 IMAGING — MG MM DIGITAL SCREENING BILAT W/ TOMO AND CAD
8 series · 8 of 24 positions shown · non-contrast
Comparison: Previous exam(s).

CLINICAL DATA: Screening.

EXAM:
DIGITAL SCREENING BILATERAL MAMMOGRAM WITH TOMOSYNTHESIS AND CAD
TECHNIQUE: Bilateral screening digital craniocaudal and mediolateral oblique
mammograms were obtained. Bilateral screening digital breast
tomosynthesis was performed. The images were evaluated with
computer-aided detection.

[L CC synth-2D]
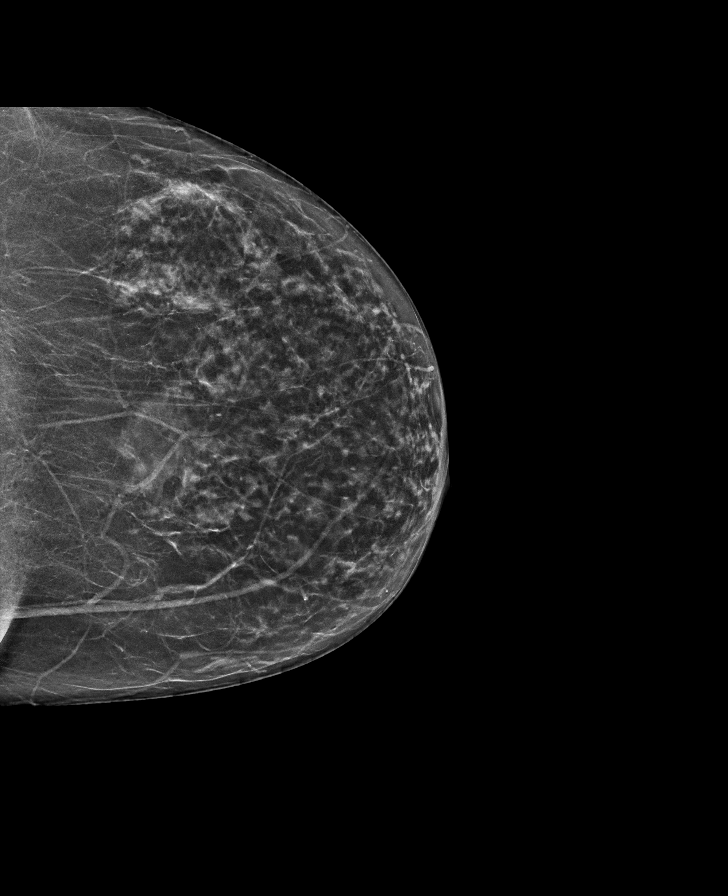

[L MLO synth-2D]
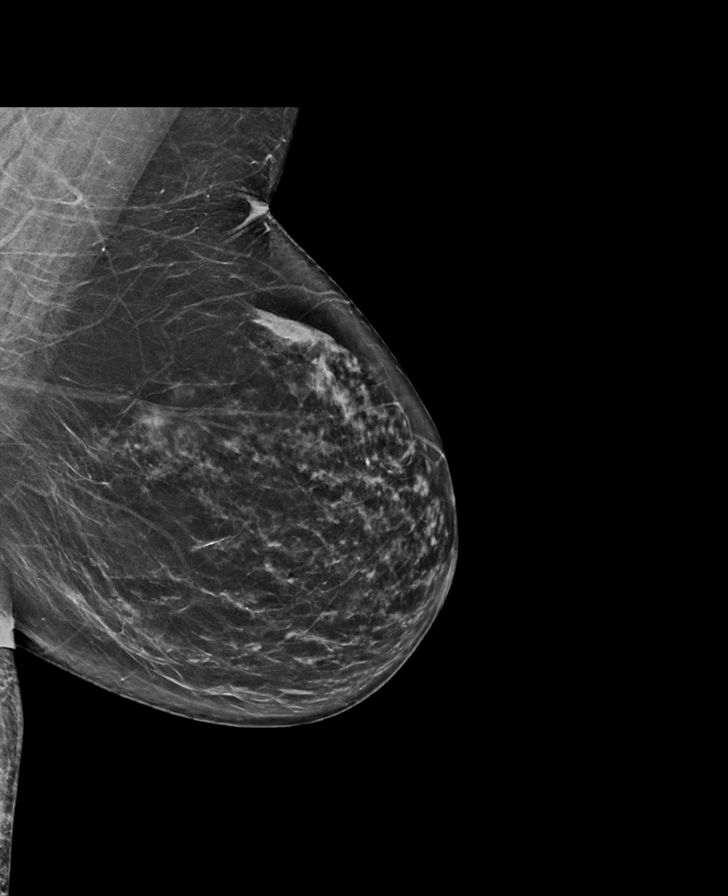

[R CC synth-2D]
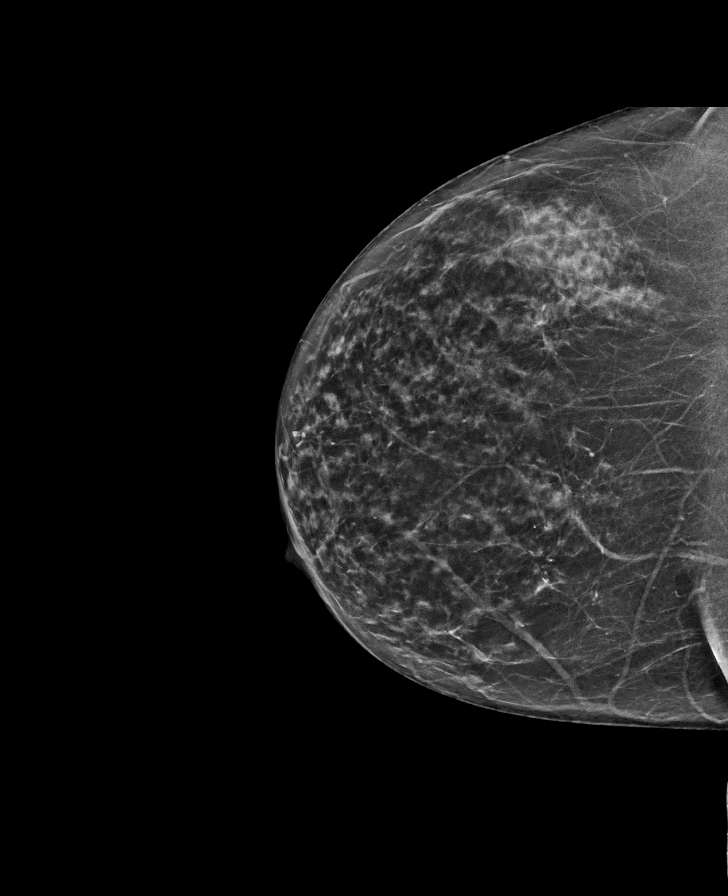

[R MLO synth-2D]
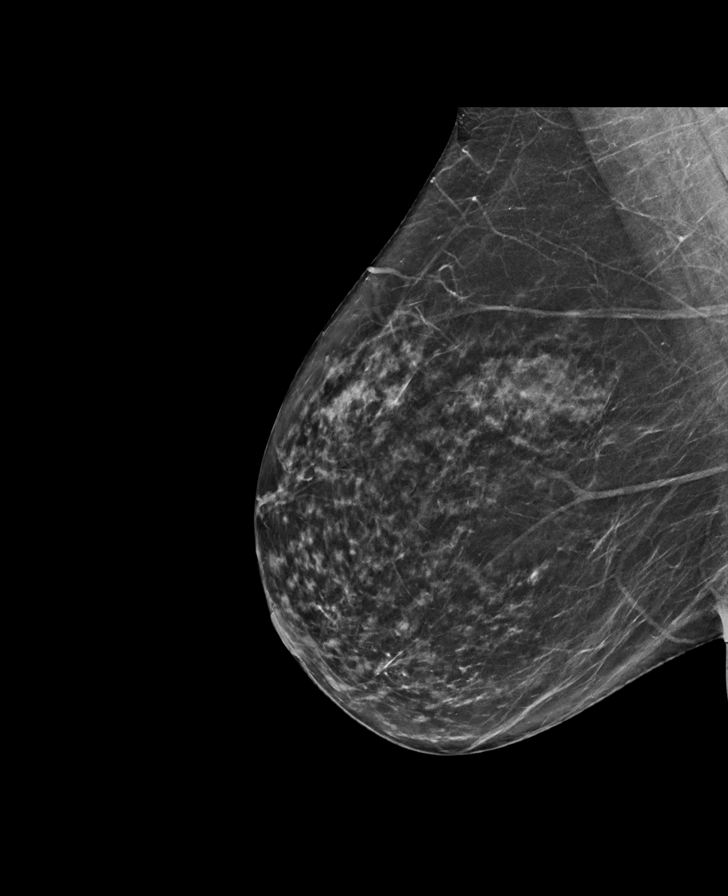

[R MLO tomo · tomo slice 34/67.0]
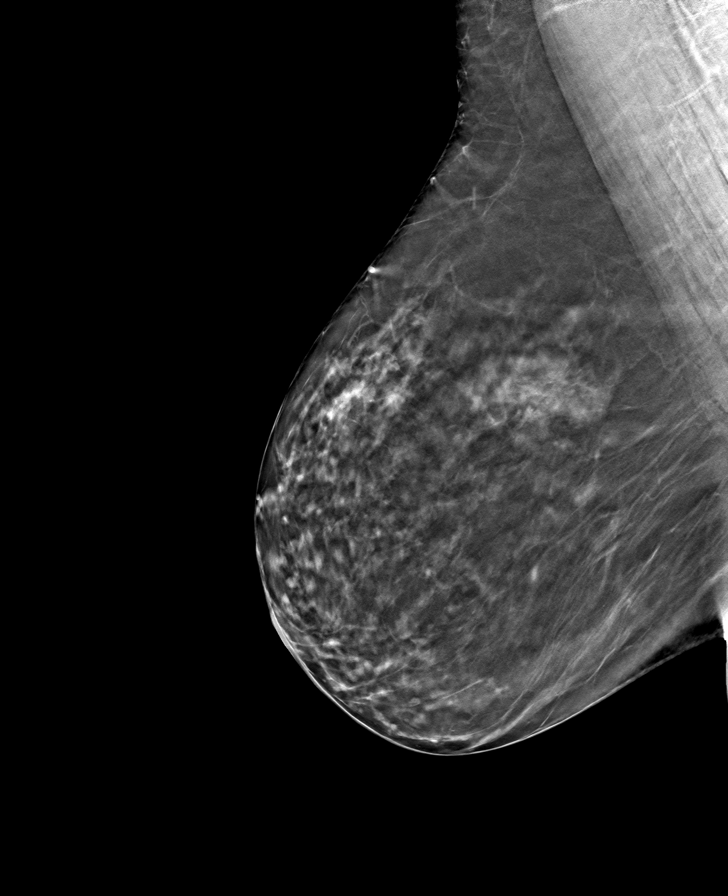

[R CC tomo · tomo slice 32/63.0]
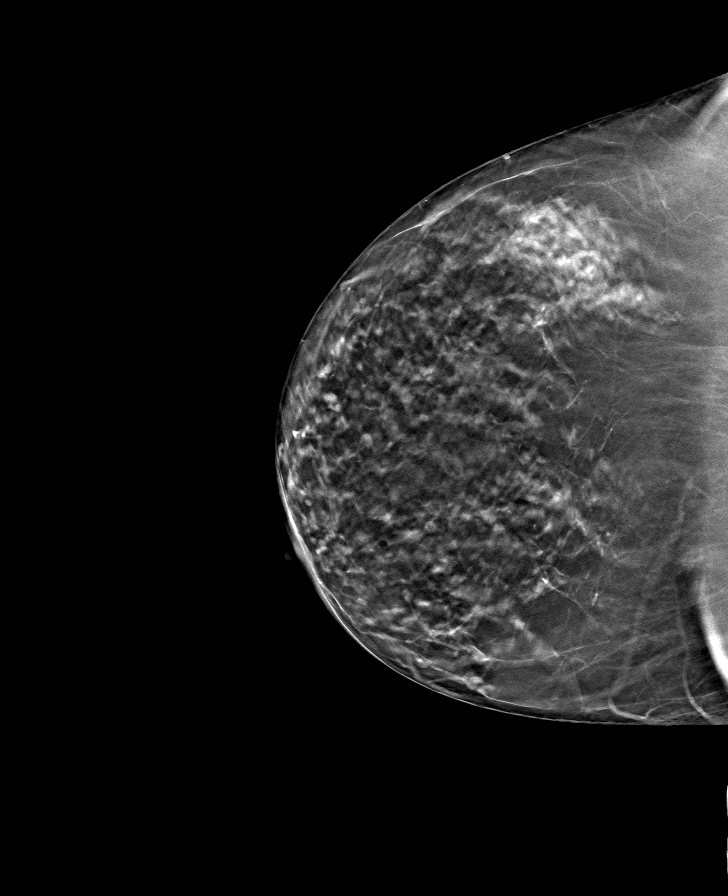

[L CC tomo · tomo slice 31/61.0]
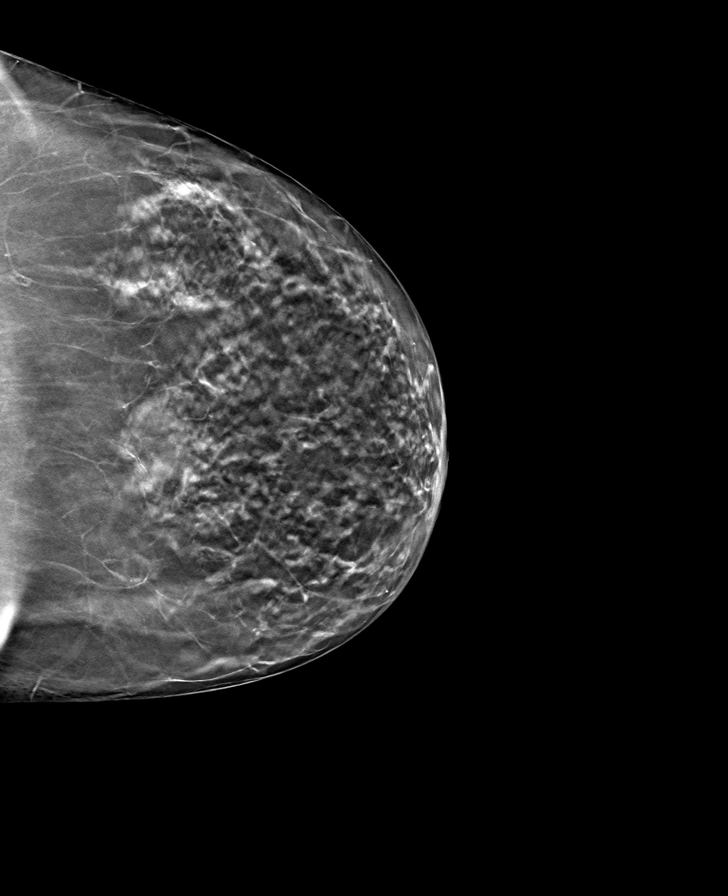

[L MLO tomo · tomo slice 37/72.0]
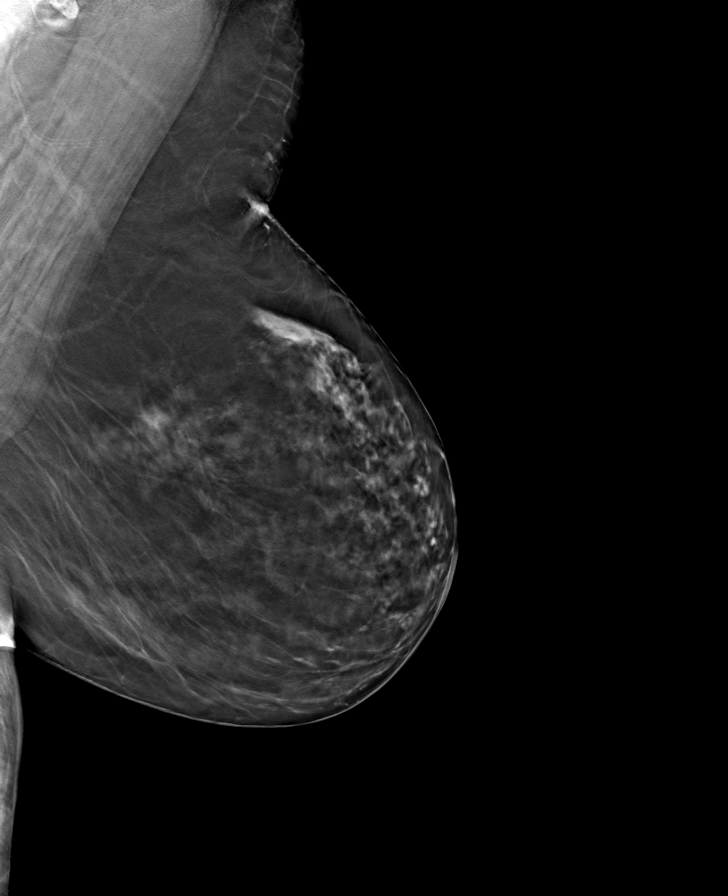

[8 of 24 positions shown; findings below may reference images not displayed]

ACR Breast Density Category b: There are scattered areas of
fibroglandular density.
FINDINGS: There are no findings suspicious for malignancy.
IMPRESSION: No mammographic evidence of malignancy. A result letter of this
screening mammogram will be mailed directly to the patient.

RECOMMENDATION:
Screening mammogram in one year. (Code:51-O-LD2)

BI-RADS CATEGORY  1: Negative.

## 2023-06-24 ENCOUNTER — Other Ambulatory Visit: Payer: Self-pay | Admitting: Obstetrics and Gynecology

## 2023-06-24 DIAGNOSIS — Z1231 Encounter for screening mammogram for malignant neoplasm of breast: Secondary | ICD-10-CM

## 2023-08-07 ENCOUNTER — Ambulatory Visit
Admission: RE | Admit: 2023-08-07 | Discharge: 2023-08-07 | Disposition: A | Discharge status: Home / Self Care | Payer: 59 | Source: Ambulatory Visit | Attending: Obstetrics and Gynecology | Admitting: Obstetrics and Gynecology

## 2023-08-07 DIAGNOSIS — Z1231 Encounter for screening mammogram for malignant neoplasm of breast: Secondary | ICD-10-CM

## 2024-05-27 ENCOUNTER — Encounter (HOSPITAL_COMMUNITY): Payer: Self-pay

## 2024-05-27 ENCOUNTER — Emergency Department (HOSPITAL_COMMUNITY)
Admission: EM | Admit: 2024-05-27 | Discharge: 2024-05-28 | Disposition: A | Discharge status: Home / Self Care | Attending: Emergency Medicine | Admitting: Emergency Medicine

## 2024-05-27 ENCOUNTER — Other Ambulatory Visit: Payer: Self-pay

## 2024-05-27 DIAGNOSIS — N200 Calculus of kidney: Secondary | ICD-10-CM

## 2024-05-27 DIAGNOSIS — N132 Hydronephrosis with renal and ureteral calculous obstruction: Secondary | ICD-10-CM | POA: Insufficient documentation

## 2024-05-27 DIAGNOSIS — R10A2 Flank pain, left side: Secondary | ICD-10-CM | POA: Diagnosis present

## 2024-05-27 MED ORDER — ONDANSETRON 4 MG PO TBDP
4.0000 mg | ORAL_TABLET | Freq: Once | ORAL | Status: AC | PRN
  Administered 2024-05-27: 4 mg via ORAL
  Filled 2024-05-27: qty 1

## 2024-05-27 NOTE — ED Triage Notes (Signed)
 Patient reports left sided flank pain. Also reports frequency in urination, nausea and vomiting. Patient has had a kidney stone on the right side in the past. Patient took aleve around 2030 with no relief.

## 2024-05-28 ENCOUNTER — Emergency Department (HOSPITAL_COMMUNITY)

## 2024-05-28 LAB — COMPREHENSIVE METABOLIC PANEL WITH GFR
ALT: 17 U/L (ref 0–44)
AST: 22 U/L (ref 15–41)
Albumin: 3.9 g/dL (ref 3.5–5.0)
Alkaline Phosphatase: 65 U/L (ref 38–126)
Anion gap: 10 (ref 5–15)
BUN: 21 mg/dL — ABNORMAL HIGH (ref 6–20)
CO2: 25 mmol/L (ref 22–32)
Calcium: 8.9 mg/dL (ref 8.9–10.3)
Chloride: 105 mmol/L (ref 98–111)
Creatinine, Ser: 0.81 mg/dL (ref 0.44–1.00)
GFR, Estimated: >60 mL/min
Glucose, Bld: 142 mg/dL — ABNORMAL HIGH (ref 70–99)
Potassium: 3.7 mmol/L (ref 3.5–5.1)
Sodium: 141 mmol/L (ref 135–145)
Total Bilirubin: 0.2 mg/dL (ref 0.0–1.2)
Total Protein: 6.4 g/dL — ABNORMAL LOW (ref 6.5–8.1)

## 2024-05-28 LAB — CBC
HCT: 36.5 % (ref 36.0–46.0)
Hemoglobin: 12.7 g/dL (ref 12.0–15.0)
MCH: 32.5 pg (ref 26.0–34.0)
MCHC: 34.8 g/dL (ref 30.0–36.0)
MCV: 93.4 fL (ref 80.0–100.0)
Platelets: 298 K/uL (ref 150–400)
RBC: 3.91 MIL/uL (ref 3.87–5.11)
RDW: 12.1 % (ref 11.5–15.5)
WBC: 11.9 K/uL — ABNORMAL HIGH (ref 4.0–10.5)
nRBC: 0 % (ref 0.0–0.2)

## 2024-05-28 LAB — URINALYSIS, ROUTINE W REFLEX MICROSCOPIC
Bacteria, UA: NONE SEEN
Bilirubin Urine: NEGATIVE
Glucose, UA: NEGATIVE mg/dL
Ketones, ur: NEGATIVE mg/dL
Leukocytes,Ua: NEGATIVE
Nitrite: NEGATIVE
Protein, ur: NEGATIVE mg/dL
RBC / HPF: >50 RBC/hpf (ref 0–5)
Specific Gravity, Urine: 1.017 (ref 1.005–1.030)
pH: 5 (ref 5.0–8.0)

## 2024-05-28 LAB — HCG, SERUM, QUALITATIVE: Preg, Serum: NEGATIVE

## 2024-05-28 MED ORDER — TAMSULOSIN HCL 0.4 MG PO CAPS
0.8000 mg | ORAL_CAPSULE | Freq: Once | ORAL | Status: AC
  Administered 2024-05-28: 0.8 mg via ORAL
  Filled 2024-05-28: qty 2

## 2024-05-28 MED ORDER — IBUPROFEN 600 MG PO TABS: 600.0000 mg | ORAL_TABLET | Freq: Four times a day (QID) | ORAL | 0 refills | Status: AC | PRN

## 2024-05-28 MED ORDER — OXYCODONE HCL 5 MG PO TABS: 5.0000 mg | ORAL_TABLET | ORAL | 0 refills | Status: AC | PRN

## 2024-05-28 MED ORDER — ONDANSETRON 4 MG PO TBDP: 4.0000 mg | ORAL_TABLET | Freq: Three times a day (TID) | ORAL | 0 refills | Status: AC | PRN

## 2024-05-28 MED ORDER — KETOROLAC TROMETHAMINE 15 MG/ML IJ SOLN
15.0000 mg | Freq: Once | INTRAMUSCULAR | Status: AC
  Administered 2024-05-28: 15 mg via INTRAMUSCULAR
  Filled 2024-05-28: qty 1

## 2024-05-28 MED ORDER — TAMSULOSIN HCL 0.4 MG PO CAPS: 0.4000 mg | ORAL_CAPSULE | Freq: Every day | ORAL | 0 refills | Status: AC

## 2024-05-28 MED ORDER — OXYCODONE-ACETAMINOPHEN 5-325 MG PO TABS
2.0000 | ORAL_TABLET | Freq: Once | ORAL | Status: AC
  Administered 2024-05-28: 2 via ORAL
  Filled 2024-05-28: qty 2

## 2024-05-28 NOTE — ED Provider Notes (Signed)
 " [place] EMERGENCY DEPARTMENT AT [facility] Provider Note   CSN: [id number] Arrival date & time: 05/27/24  2321     Patient presents with: Flank Pain   Courtney Vasquez is a 54 y.o. female.  Patient with reported past medical history significant for kidney stones presents to the emergency room complaint of left-sided flank pain with increased urinary frequency, nausea, and vomiting.  Patient states she tried Aleve at 830 this evening with no relief.  In the past she passed the kidney stone without intervention.    Flank Pain       Prior to Admission medications  Medication Sig Start Date End Date Taking? Authorizing Provider  ibuprofen (ADVIL) 600 MG tablet Take 1 tablet (600 mg total) by mouth every 6 (six) hours as needed. 05/28/24  Yes Courtney Ubaldo NOVAK, PA-C  ondansetron  (ZOFRAN -ODT) 4 MG disintegrating tablet Take 1 tablet (4 mg total) by mouth every 8 (eight) hours as needed for nausea or vomiting. 05/28/24  Yes Courtney Ubaldo NOVAK, PA-C  oxyCODONE (ROXICODONE) 5 MG immediate release tablet Take 1 tablet (5 mg total) by mouth every 4 (four) hours as needed for severe pain (pain score 7-10). 05/28/24  Yes Courtney Ubaldo NOVAK, PA-C  tamsulosin  (FLOMAX ) 0.4 MG CAPS capsule Take 1 capsule (0.4 mg total) by mouth daily. 05/28/24 06/27/24 Yes Courtney Ubaldo NOVAK, PA-C  HYDROcodone -acetaminophen  (NORCO/VICODIN) 5-325 MG per tablet Take 1 tablet by mouth every 4 (four) hours as needed for pain. 11/25/12   Penne Noe, MD  ondansetron  (ZOFRAN ) 4 MG tablet Take 1 tablet (4 mg total) by mouth every 8 (eight) hours as needed for nausea. 11/25/12   Penne Noe, MD    Allergies: Patient has no known allergies.    Review of Systems  Genitourinary:  Positive for flank pain.    Updated Vital Signs BP 123/70 (BP Location: Right Arm)   Pulse (!) 58   Temp 98.4 F (36.9 C) (Oral)   Resp 16   Ht 5' 1 (1.549 m)   Wt 74.8 kg   SpO2 96%   BMI 31.18 kg/m   Physical Exam Vitals and nursing note  reviewed.  Constitutional:      General: She is not in acute distress.    Appearance: She is well-developed.  HENT:     Head: Normocephalic and atraumatic.  Eyes:     Conjunctiva/sclera: Conjunctivae normal.  Cardiovascular:     Rate and Rhythm: Normal rate and regular rhythm.  Pulmonary:     Effort: Pulmonary effort is normal. No respiratory distress.     Breath sounds: Normal breath sounds.  Abdominal:     Palpations: Abdomen is soft.     Tenderness: There is no abdominal tenderness. There is left CVA tenderness.  Musculoskeletal:        General: No swelling.     Cervical back: Neck supple.  Skin:    General: Skin is warm and dry.     Capillary Refill: Capillary refill takes less than 2 seconds.  Neurological:     Mental Status: She is alert.  Psychiatric:        Mood and Affect: Mood normal.     (all labs ordered are listed, but only abnormal results are displayed) Labs Reviewed  COMPREHENSIVE METABOLIC PANEL WITH GFR - Abnormal; Notable for the following components:      Result Value   Glucose, Bld 142 (*)    BUN 21 (*)    Total Protein 6.4 (*)  All other components within normal limits  CBC - Abnormal; Notable for the following components:   WBC 11.9 (*)    All other components within normal limits  URINALYSIS, ROUTINE W REFLEX MICROSCOPIC - Abnormal; Notable for the following components:   APPearance HAZY (*)    Hgb urine dipstick LARGE (*)    All other components within normal limits  HCG, SERUM, QUALITATIVE    EKG: None  Radiology: CT Renal Stone Study Result Date: 05/28/2024 EXAM: CT ABDOMEN AND PELVIS WITHOUT CONTRAST 05/28/2024 12:14:46 AM TECHNIQUE: CT of the abdomen and pelvis was performed without the administration of intravenous contrast. Multiplanar reformatted images are provided for review. Automated exposure control, iterative reconstruction, and/or weight-based adjustment of the mA/kV was utilized to reduce the radiation dose to as low as  reasonably achievable. COMPARISON: None available. CLINICAL HISTORY: Abdominal/flank pain. Nephrolithiasis. FINDINGS: LOWER CHEST: No acute abnormality. LIVER: The liver is unremarkable. GALLBLADDER AND BILE DUCTS: Gallbladder is unremarkable. No biliary ductal dilatation. SPLEEN: No acute abnormality. PANCREAS: No acute abnormality. ADRENAL GLANDS: No acute abnormality. KIDNEYS, URETERS AND BLADDER: The kidneys are normal in size and position. There is mild-to-moderate left hydronephrosis and hydroureter secondary to an obstructing 4 mm calculus within the distal left ureter 2-3 cm proximal to the left ureterovesicular junction. Additional nonobstructing renal calculi are present within the left kidney measuring up to 6 mm. No perinephric fluid collection. No hydronephrosis on the right. No intrarenal or ureteral calculi on the right. The bladder is decompressed. GI AND BOWEL: Appendix normal. The stomach, small bowel, and large bowel are otherwise unremarkable. PERITONEUM AND RETROPERITONEUM: No ascites. No free air. VASCULATURE: Mild aortoiliac atherosclerotic calcification. No aortic aneurysm. LYMPH NODES: No lymphadenopathy. REPRODUCTIVE ORGANS: No acute abnormality. BONES AND SOFT TISSUES: Osseous structures are age appropriate. No acute bone abnormality. No lytic or blastic bone lesion. No focal soft tissue abnormality. IMPRESSION: 1. Mild-to-moderate left hydronephrosis and hydroureter due to an obstructing 4 mm calculus in the distal left ureter, 2-3 cm proximal to the left ureterovesicular junction. 2. Additional nonobstructing left renal calculi measuring up to 6 mm. 3. Raf score includes aortic atherosclerosis (ICD10-I70.0). Electronically signed by: Dorethia Molt MD 05/28/2024 12:48 AM EST RP Workstation: [id number]     Procedures   Medications Ordered in the ED  ondansetron  (ZOFRAN -ODT) disintegrating tablet 4 mg (4 mg Oral Given 05/27/24 2348)  ketorolac (TORADOL) 15 MG/ML injection 15 mg (15 mg  Intramuscular Given 05/28/24 0130)  tamsulosin  (FLOMAX ) capsule 0.8 mg (0.8 mg Oral Given 05/28/24 0130)  oxyCODONE-acetaminophen  (PERCOCET/ROXICET) 5-325 MG per tablet 2 tablet (2 tablets Oral Given 05/28/24 0129)                                    Medical Decision Making Amount and/or Complexity of Data Reviewed Radiology: ordered.  Risk Prescription drug management.   This patient presents to the ED for concern of flank pain, this involves an extensive number of treatment options, and is a complaint that carries with it a high risk of complications and morbidity.  The differential diagnosis includes nephrolithiasis, hydronephrosis, pyelonephritis, others   Co morbidities / Chronic conditions that complicate the patient evaluation  History of kidney stones   Additional history obtained:  Additional history obtained from EMR   Lab Tests:  I Ordered, and personally interpreted labs.  The pertinent results include: White count of 11,900, UA with hemoglobin but no signs of infection  Imaging Studies ordered:  I ordered imaging studies including CT renal stone study I independently visualized and interpreted imaging which showed  1. Mild-to-moderate left hydronephrosis and hydroureter due to an obstructing 4  mm calculus in the distal left ureter, 2-3 cm proximal to the left  ureterovesicular junction.  2. Additional nonobstructing left renal calculi measuring up to 6 mm.  3. Raf score includes aortic atherosclerosis   I agree with the radiologist interpretation   Problem List / ED Course / Critical interventions / Medication management   I ordered medication including Toradol, Flomax , Percocet, Zofran  Reevaluation of the patient after these medicines showed that the patient improved I have reviewed the patients home medicines and have made adjustments as needed   Test / Admission - Considered:  Patient with CT evidence of nephrolithiasis with some mild to moderate  hydronephrosis.  Patient prescribed Roxicodone, Zofran , Flomax , and ibuprofen.  She is tolerating oral intake.  Patient provided with follow-up instructions for urology.  Return precautions provided.      Final diagnoses:  Nephrolithiasis    ED Discharge Orders          Ordered    ondansetron  (ZOFRAN -ODT) 4 MG disintegrating tablet  Every 8 hours PRN        05/28/24 0215    ibuprofen (ADVIL) 600 MG tablet  Every 6 hours PRN        05/28/24 0215    oxyCODONE (ROXICODONE) 5 MG immediate release tablet  Every 4 hours PRN        05/28/24 0215    tamsulosin  (FLOMAX ) 0.4 MG CAPS capsule  Daily        05/28/24 0215               Courtney Vasquez 05/28/24 0216    Courtney Golas, MD 05/28/24 [id number]  "

## 2024-05-28 NOTE — Discharge Instructions (Addendum)
You were diagnosed today with a kidney stone.  I have prescribed ibuprofen to be taken every 6 hours, or oxycodone for breakthrough pain, Zofran to be taken as needed for nausea, and Flomax to be taken daily.  I have also provided contact information for urology for follow-up.  Return to the ED immediately if you develop fever, uncontrolled pain or vomiting, or other concerns.
